# Patient Record
Sex: Female | Born: 1985 | Hispanic: Yes | Marital: Single
Health system: Southern US, Community
[De-identification: ages and names within clinical notes are randomized; demographics above are authoritative.]

---

## 2010-05-23 ENCOUNTER — Encounter: Admission: RE | Admit: 2010-05-23 | Discharge: 2010-05-26 | Payer: Self-pay | Admitting: Obstetrics & Gynecology

## 2010-05-23 ENCOUNTER — Ambulatory Visit: Payer: Self-pay | Admitting: Obstetrics & Gynecology

## 2010-06-06 ENCOUNTER — Ambulatory Visit: Payer: Self-pay | Admitting: Obstetrics and Gynecology

## 2010-06-06 ENCOUNTER — Encounter
Admission: RE | Admit: 2010-06-06 | Discharge: 2010-09-04 | Payer: Self-pay | Source: Home / Self Care | Attending: Obstetrics & Gynecology | Admitting: Obstetrics & Gynecology

## 2010-06-20 ENCOUNTER — Ambulatory Visit: Payer: Self-pay | Admitting: Obstetrics & Gynecology

## 2010-06-27 ENCOUNTER — Ambulatory Visit: Payer: Self-pay | Admitting: Family Medicine

## 2010-07-11 ENCOUNTER — Ambulatory Visit: Payer: Self-pay | Admitting: Obstetrics and Gynecology

## 2010-07-11 ENCOUNTER — Ambulatory Visit (HOSPITAL_COMMUNITY): Admission: RE | Admit: 2010-07-11 | Discharge: 2010-07-11 | Payer: Self-pay | Admitting: Obstetrics and Gynecology

## 2010-07-11 ENCOUNTER — Encounter: Payer: Self-pay | Admitting: Physician Assistant

## 2010-07-11 LAB — CONVERTED CEMR LAB
ALT: 36 units/L — ABNORMAL HIGH (ref 0–35)
AST: 56 units/L — ABNORMAL HIGH (ref 0–37)
Albumin: 3.8 g/dL (ref 3.5–5.2)
Alkaline Phosphatase: 100 units/L (ref 39–117)
BUN: 10 mg/dL (ref 6–23)
CO2: 19 meq/L (ref 19–32)
Calcium: 9.2 mg/dL (ref 8.4–10.5)
Chloride: 102 meq/L (ref 96–112)
Collection Interval-CRCL: 24 hr
Creatinine 24 HR UR: 1394 mg/24hr (ref 700–1800)
Creatinine Clearance: 236 mL/min — ABNORMAL HIGH (ref 75–115)
Creatinine, Ser: 0.41 mg/dL (ref 0.40–1.20)
Creatinine, Urine: 62.6 mg/dL
Glucose, Bld: 223 mg/dL — ABNORMAL HIGH (ref 70–99)
HCT: 39 % (ref 36.0–46.0)
Hemoglobin: 12.4 g/dL (ref 12.0–15.0)
MCHC: 31.8 g/dL (ref 30.0–36.0)
MCV: 87.2 fL (ref 78.0–100.0)
Platelets: 309 10*3/uL (ref 150–400)
Potassium: 4.7 meq/L (ref 3.5–5.3)
Protein, Ur: 178 mg/24hr — ABNORMAL HIGH (ref 50–100)
RBC: 4.47 M/uL (ref 3.87–5.11)
RDW: 13.5 % (ref 11.5–15.5)
Sodium: 135 meq/L (ref 135–145)
Total Bilirubin: 0.4 mg/dL (ref 0.3–1.2)
Total Protein: 7.3 g/dL (ref 6.0–8.3)
WBC: 9.3 10*3/uL (ref 4.0–10.5)

## 2010-08-01 ENCOUNTER — Ambulatory Visit: Payer: Self-pay | Admitting: Obstetrics & Gynecology

## 2010-08-01 ENCOUNTER — Encounter: Payer: Self-pay | Admitting: Physician Assistant

## 2010-08-01 LAB — CONVERTED CEMR LAB
ALT: 32 units/L (ref 0–35)
AST: 36 units/L (ref 0–37)
Albumin: 3.5 g/dL (ref 3.5–5.2)
Alkaline Phosphatase: 113 units/L (ref 39–117)
BUN: 11 mg/dL (ref 6–23)
CO2: 18 meq/L — ABNORMAL LOW (ref 19–32)
Calcium: 8.7 mg/dL (ref 8.4–10.5)
Chloride: 103 meq/L (ref 96–112)
Creatinine, Ser: 0.4 mg/dL (ref 0.40–1.20)
Glucose, Bld: 219 mg/dL — ABNORMAL HIGH (ref 70–99)
Potassium: 4.2 meq/L (ref 3.5–5.3)
Sodium: 135 meq/L (ref 135–145)
Total Bilirubin: 0.4 mg/dL (ref 0.3–1.2)
Total Protein: 6.7 g/dL (ref 6.0–8.3)

## 2010-08-08 ENCOUNTER — Ambulatory Visit: Payer: Self-pay | Admitting: Obstetrics and Gynecology

## 2010-08-11 ENCOUNTER — Ambulatory Visit: Payer: Self-pay | Admitting: Obstetrics and Gynecology

## 2010-08-15 ENCOUNTER — Inpatient Hospital Stay (HOSPITAL_COMMUNITY)
Admission: AD | Admit: 2010-08-15 | Discharge: 2010-08-17 | Payer: Self-pay | Source: Home / Self Care | Attending: Obstetrics & Gynecology | Admitting: Obstetrics & Gynecology

## 2010-08-15 ENCOUNTER — Ambulatory Visit: Payer: Self-pay | Admitting: Obstetrics and Gynecology

## 2010-08-15 ENCOUNTER — Encounter: Payer: Self-pay | Admitting: Obstetrics & Gynecology

## 2010-08-19 ENCOUNTER — Ambulatory Visit: Payer: Self-pay | Admitting: Obstetrics and Gynecology

## 2010-08-23 ENCOUNTER — Ambulatory Visit (HOSPITAL_COMMUNITY)
Admission: RE | Admit: 2010-08-23 | Discharge: 2010-08-23 | Payer: Self-pay | Source: Home / Self Care | Attending: Obstetrics & Gynecology | Admitting: Obstetrics & Gynecology

## 2010-08-23 ENCOUNTER — Ambulatory Visit: Payer: Self-pay | Admitting: Obstetrics and Gynecology

## 2010-08-25 ENCOUNTER — Ambulatory Visit: Payer: Self-pay | Admitting: Obstetrics & Gynecology

## 2010-08-29 ENCOUNTER — Ambulatory Visit
Admission: RE | Admit: 2010-08-29 | Discharge: 2010-08-29 | Payer: Self-pay | Source: Home / Self Care | Attending: Obstetrics and Gynecology | Admitting: Obstetrics and Gynecology

## 2010-08-30 ENCOUNTER — Encounter: Payer: Self-pay | Admitting: Family Medicine

## 2010-08-30 LAB — CONVERTED CEMR LAB
ALT: 32 units/L (ref 0–35)
AST: 43 units/L — ABNORMAL HIGH (ref 0–37)
Albumin: 3.1 g/dL — ABNORMAL LOW (ref 3.5–5.2)
Alkaline Phosphatase: 172 units/L — ABNORMAL HIGH (ref 39–117)
BUN: 9 mg/dL (ref 6–23)
CO2: 19 meq/L (ref 19–32)
Calcium: 8.8 mg/dL (ref 8.4–10.5)
Chloride: 107 meq/L (ref 96–112)
Creatinine, Ser: 0.46 mg/dL (ref 0.40–1.20)
Glucose, Bld: 122 mg/dL — ABNORMAL HIGH (ref 70–99)
HCT: 36.3 % (ref 36.0–46.0)
Hemoglobin: 11.8 g/dL — ABNORMAL LOW (ref 12.0–15.0)
MCHC: 32.5 g/dL (ref 30.0–36.0)
MCV: 83.4 fL (ref 78.0–100.0)
Platelets: 255 10*3/uL (ref 150–400)
Potassium: 4.3 meq/L (ref 3.5–5.3)
RBC: 4.35 M/uL (ref 3.87–5.11)
RDW: 14 % (ref 11.5–15.5)
Sodium: 138 meq/L (ref 135–145)
Total Bilirubin: 0.3 mg/dL (ref 0.3–1.2)
Total Protein: 6 g/dL (ref 6.0–8.3)
WBC: 7.6 10*3/uL (ref 4.0–10.5)

## 2010-09-01 ENCOUNTER — Ambulatory Visit
Admission: RE | Admit: 2010-09-01 | Discharge: 2010-09-01 | Payer: Self-pay | Source: Home / Self Care | Attending: Family Medicine | Admitting: Family Medicine

## 2010-09-01 ENCOUNTER — Encounter: Payer: Self-pay | Admitting: Family Medicine

## 2010-09-01 LAB — CONVERTED CEMR LAB
Collection Interval-CRCL: 24 hr
Creatinine 24 HR UR: 1167 mg/24hr (ref 700–1800)
Creatinine Clearance: 176 mL/min — ABNORMAL HIGH (ref 75–115)
Creatinine, Urine: 60.6 mg/dL
Protein, 24H Urine: 1059 mg/24hr — ABNORMAL HIGH (ref 50–100)

## 2010-09-05 ENCOUNTER — Encounter (INDEPENDENT_AMBULATORY_CARE_PROVIDER_SITE_OTHER): Payer: Self-pay | Admitting: *Deleted

## 2010-09-05 ENCOUNTER — Ambulatory Visit
Admission: RE | Admit: 2010-09-05 | Discharge: 2010-09-05 | Payer: Self-pay | Source: Home / Self Care | Attending: Obstetrics & Gynecology | Admitting: Obstetrics & Gynecology

## 2010-09-05 ENCOUNTER — Inpatient Hospital Stay (HOSPITAL_COMMUNITY)
Admission: AD | Admit: 2010-09-05 | Discharge: 2010-09-08 | Payer: Self-pay | Source: Home / Self Care | Attending: Obstetrics and Gynecology | Admitting: Obstetrics and Gynecology

## 2010-09-05 LAB — CONVERTED CEMR LAB
Chlamydia, DNA Probe: NEGATIVE
GC Probe Amp, Genital: NEGATIVE

## 2010-09-08 ENCOUNTER — Ambulatory Visit: Admit: 2010-09-08 | Payer: Self-pay | Admitting: Obstetrics and Gynecology

## 2010-09-12 ENCOUNTER — Ambulatory Visit: Admit: 2010-09-12 | Payer: Self-pay | Admitting: Obstetrics and Gynecology

## 2010-09-12 LAB — CBC
HCT: 35.8 % — ABNORMAL LOW (ref 36.0–46.0)
HCT: 34.3 % — ABNORMAL LOW (ref 36.0–46.0)
HCT: 34.2 % — ABNORMAL LOW (ref 36.0–46.0)
Hemoglobin: 11.3 g/dL — ABNORMAL LOW (ref 12.0–15.0)
Hemoglobin: 11.3 g/dL — ABNORMAL LOW (ref 12.0–15.0)
Hemoglobin: 11.1 g/dL — ABNORMAL LOW (ref 12.0–15.0)
MCH: 26.2 pg (ref 26.0–34.0)
MCH: 27 pg (ref 26.0–34.0)
MCH: 26.5 pg (ref 26.0–34.0)
MCHC: 31.6 g/dL (ref 30.0–36.0)
MCHC: 32.9 g/dL (ref 30.0–36.0)
MCHC: 32.5 g/dL (ref 30.0–36.0)
MCV: 82.9 fL (ref 78.0–100.0)
MCV: 82.1 fL (ref 78.0–100.0)
MCV: 81.6 fL (ref 78.0–100.0)
Platelets: 239 10*3/uL (ref 150–400)
Platelets: 277 10*3/uL (ref 150–400)
Platelets: 231 10*3/uL (ref 150–400)
RBC: 4.32 MIL/uL (ref 3.87–5.11)
RBC: 4.18 MIL/uL (ref 3.87–5.11)
RBC: 4.19 MIL/uL (ref 3.87–5.11)
RDW: 14.1 % (ref 11.5–15.5)
RDW: 14.3 % (ref 11.5–15.5)
RDW: 14.1 % (ref 11.5–15.5)
WBC: 9.6 10*3/uL (ref 4.0–10.5)
WBC: 10 10*3/uL (ref 4.0–10.5)
WBC: 9.9 10*3/uL (ref 4.0–10.5)

## 2010-09-12 LAB — COMPREHENSIVE METABOLIC PANEL
ALT: 47 U/L — ABNORMAL HIGH (ref 0–35)
AST: 57 U/L — ABNORMAL HIGH (ref 0–37)
Albumin: 2.4 g/dL — ABNORMAL LOW (ref 3.5–5.2)
Alkaline Phosphatase: 194 U/L — ABNORMAL HIGH (ref 39–117)
BUN: 9 mg/dL (ref 6–23)
CO2: 23 mEq/L (ref 19–32)
Calcium: 8.7 mg/dL (ref 8.4–10.5)
Chloride: 106 mEq/L (ref 96–112)
Creatinine, Ser: 0.46 mg/dL (ref 0.4–1.2)
GFR calc Af Amer: >60 mL/min (ref >60)
GFR calc non Af Amer: >60 mL/min (ref >60)
Glucose, Bld: 78 mg/dL (ref 70–99)
Potassium: 4.4 mEq/L (ref 3.5–5.1)
Sodium: 137 mEq/L (ref 135–145)
Total Bilirubin: 0.2 mg/dL — ABNORMAL LOW (ref 0.3–1.2)
Total Protein: 7 g/dL (ref 6.0–8.3)

## 2010-09-12 LAB — GLUCOSE, CAPILLARY
Glucose-Capillary: 79 mg/dL (ref 70–99)
Glucose-Capillary: 74 mg/dL (ref 70–99)
Glucose-Capillary: 75 mg/dL (ref 70–99)
Glucose-Capillary: 94 mg/dL (ref 70–99)
Glucose-Capillary: 123 mg/dL — ABNORMAL HIGH (ref 70–99)
Glucose-Capillary: 121 mg/dL — ABNORMAL HIGH (ref 70–99)
Glucose-Capillary: 123 mg/dL — ABNORMAL HIGH (ref 70–99)
Glucose-Capillary: 112 mg/dL — ABNORMAL HIGH (ref 70–99)
Glucose-Capillary: 128 mg/dL — ABNORMAL HIGH (ref 70–99)
Glucose-Capillary: 121 mg/dL — ABNORMAL HIGH (ref 70–99)

## 2010-09-12 LAB — MRSA PCR SCREENING: MRSA by PCR: NEGATIVE

## 2010-09-12 LAB — RPR: RPR Ser Ql: NONREACTIVE

## 2010-09-17 ENCOUNTER — Encounter: Payer: Self-pay | Admitting: *Deleted

## 2010-10-10 NOTE — Op Note (Addendum)
[name], [name]        ACCOUNT NO.:  [id number]  MEDICAL RECORD NO.:  [id number]         PATIENT TYPE:  WINP  LOCATION:  372                           FACILITY:  [facility]  PHYSICIAN:  Scheryl Darter, MD       DATE OF BIRTH:  [birth year]  DATE OF PROCEDURE:  09/05/2010 DATE OF DISCHARGE:                              OPERATIVE REPORT   PREOPERATIVE DIAGNOSES:  Severe preeclampsia, breech presentation after failed version x2 attempts, gestational diabetes.  POSTOPERATIVE DIAGNOSES:  Severe preeclampsia, breech presentation after failed version x2 attempts, gestational diabetes.  This is a 25 year old gravida 1, para 0 at 58 weeks' estimated gestational age.  PROCEDURE:  A primary low transverse cesarean section.  SURGEON:  Scheryl Darter, MD and Lucina Mellow, DO.  ANESTHESIA:  Spinal.  COMPLICATIONS:  None.  ESTIMATED BLOOD LOSS:  1000.  FLUIDS:  1000.  URINE OUTPUT:  200.  INDICATION:  The patient is a 25 year old gravida 1 at 50 and 4 weeks' estimated gestational age with severe preeclampsia by blood pressure values and elevated urine protein at over 1000 grams, breech presentation with failed version.  The findings include a viable female infant in frank breech presentation.  Apgars were 4 and 9 at 1 and 5 minutes.  Weight was 8 pounds 15 ounces.  The uterus was normal. Placenta was normal.  Ovaries were normal.  PROCEDURE:  The patient was taken to the operating room where spinal anesthesia was found to be adequate.  She was prepared and draped in normal sterile fashion in the dorsal supine position with a leftward tilt.  1 gram of Ancef was provided IV prior to the skin incision.  A Pfannenstiel skin incision was then made with scalpel and carried through to the underlying layer of the fascia with the knife and blunt dissection.  The fascia was incised in the midline and extended laterally with Mayo scissors.  The superior aspect of the fascial incision was  then grasped with Kocher clamps, elevated, and underlying rectus muscles dissected off bluntly.  Attention was then turned to the inferior aspect of the incision and in similar fashion was grasped, tented up with Kocher clamps and rectus muscles dissected off bluntly. The rectus muscles were separated in the midline.  The peritoneum was identified, tented up, and entered sharply with the Metzenbaum scissors. The peritoneal incision was extended superiorly and inferiorly with good visualization of the bladder.  The bladder blade was inserted and the vesicouterine peritoneum was identified, grasped with pickups and entered sharply with the Metzenbaum scissors.  This incision was then extended laterally and the bladder flap was created digitally.  Alexis self-retractor was then inserted and the lower uterine segment was incised in transverse fashion with a scalpel.  The uterine incision was then extended laterally with blunt dissection and Mayo scissors.  The infant's buttocks was delivered with then the infant's right leg and foot was delivered.  Infant was rotated, the left leg of infant was rotated.  Infant was pulled to the shoulders at which time the left arm was delivered.  The infant was then rotated to deliver the right arm and then the hand was delivered all atraumatically  without difficulty.  The cord was clamped x2 and cut and provided to the awaiting NICU team.  The placenta was then delivered spontaneously and with a manual assistance, and the uterus was cleared of all clots and debris.  The uterine incision was repaired with Vicryl suture in a running locked fashion and a second layer of the same was used to obtain excellent hemostasis.  The gutters were cleared of all clots, and the peritoneum was closed with Vicryl suture.  The fascia was reapproximated with 0 Vicryl in a running fashion.  Three separate subcutaneous fat sutures were made to approximate the fat layer of the  skin.  The skin was closed with staples.  The patient tolerated this procedure well.  Sponge, lap, and instrument, needle count was correct x2.  The patient was taken to the recovery room in stable condition.  In the PACU, the patient will go to the AICU where she will receive 24 hours of magnesium postoperatively. The baby is currently in the NICU in recovery as well.    ______________________________ Lucina Mellow, DO   ______________________________ Scheryl Darter, MD    SH/MEDQ  D:  09/05/2010  T:  09/06/2010  Job:  [id number]  Electronically Signed by Lucina Mellow MD on 10/10/2010 05:08:46 PM Electronically Signed by Scheryl Darter MD on 10/18/2010 11:12:39 AM

## 2010-10-23 NOTE — Discharge Summary (Signed)
  NAMENAVIA, LINDAHL        ACCOUNT NO.:  [id number]  MEDICAL RECORD NO.:  [id number]          PATIENT TYPE:  INP  LOCATION:  9317                          FACILITY:  [facility]  PHYSICIAN:  Catalina Antigua, MD     DATE OF BIRTH:  21-Jun-1986  DATE OF ADMISSION:  09/05/2010 DATE OF DISCHARGE:  09/08/2010                              DISCHARGE SUMMARY   ADMISSION DIAGNOSES:  Pregnancy at 36 weeks, severe preeclampsia, baby breech position.  DISCHARGE DIAGNOSES:  Severe preeclampsia, diabetes type 2.  PROCEDURE:  Primary low transverse cesarean section.  INDICATION:  A 25 year old gravida 1 at 63 and 18 weeks' gestational age with severe preeclampsia and frank breech presentation.  Procedure was done without complications.  LABS AT DISCHARGE:  CBG 112, hematocrit 34.2, hemoglobin 11.1. on September 06, 2010.  HISTORY AND PHYSICAL:  Please refer to history and physical done at the time of admission.  HOSPITAL COURSE:  This is a 25 year old female with G1, P1, who was transferred from [facility] to [facility] because of severe preeclampsia and gestational diabetes.  At the [facility], protein in the urine was 1000 mg and glucose was high.  Also baby was in breech presentation.  Because of this complications, the patient was admitted.  During this hospitalization, two attempts of external rotation was done, it was unsuccessful.  The patient was advised to have a C-section because of the breech presentation.  The patient underwent primary low transverse C-section, it was successful without complications.  Finding was baby in a breech position, Apgars 4 at 1 minute and 9 at 5 minutes, weight 8 pounds, 15-ounce.  After the procedure, the patient was transferred to the Maternal ICU Unit for complete treatment of 24 hours magnesium.  During this period, the patient was remained stable and no other complications were reported. Also the patient with  maternal gestational diabetes and CBGs were stable, no need for treatment with insulin.  At discharge, the patient was doing well with no complications.  The patient was recommended to follow a diabetic diet kit.  The patient will follow 6 weeks at [facility].  The patient was advised to use a Depo-Provera before discharge for anti-conception because of multiple complications during this pregnancy.  Also, we will start metformin 500 mg b.i.d. to treat diabetes.  She will need to follow up these issues with her primary care physician and establish management.  MEDICATIONS AT DISCHARGE: 1. Percocet 5 mg/325 mg 1 tab every 4 hours for pain as needed. 2. Motrin 600 mg q.6 h. for pain as needed. 3. Docusate sodium 100 mg take 1 tablet by mouth twice a day as needed     for constipation. 4. Metformin 500 mg twice a day for diabetes. 5. Depo-Provera intramuscular 1 injection before discharge.    ______________________________ Obstetrics Resident   ______________________________ Catalina Antigua, MD    OR/MEDQ  D:  09/08/2010  T:  09/08/2010  Job:  [id number]  Electronically Signed by Catalina Antigua  on 10/23/2010 10:19:28 AM

## 2010-11-07 LAB — CBC
HCT: 39.4 % (ref 36.0–46.0)
Hemoglobin: 13.3 g/dL (ref 12.0–15.0)
MCH: 27.1 pg (ref 26.0–34.0)
MCHC: 33.8 g/dL (ref 30.0–36.0)
MCV: 80.2 fL (ref 78.0–100.0)
Platelets: 221 10*3/uL (ref 150–400)
Platelets: 253 10*3/uL (ref 150–400)
RBC: 4.57 MIL/uL (ref 3.87–5.11)
RBC: 4.91 MIL/uL (ref 3.87–5.11)
RDW: 13.1 % (ref 11.5–15.5)
WBC: 7.6 10*3/uL (ref 4.0–10.5)
WBC: 8 10*3/uL (ref 4.0–10.5)

## 2010-11-07 LAB — COMPREHENSIVE METABOLIC PANEL
ALT: 25 U/L (ref 0–35)
AST: 38 U/L — ABNORMAL HIGH (ref 0–37)
AST: 40 U/L — ABNORMAL HIGH (ref 0–37)
Albumin: 2.3 g/dL — ABNORMAL LOW (ref 3.5–5.2)
Alkaline Phosphatase: 116 U/L (ref 39–117)
BUN: 7 mg/dL (ref 6–23)
CO2: 17 mEq/L — ABNORMAL LOW (ref 19–32)
Calcium: 9.1 mg/dL (ref 8.4–10.5)
Chloride: 105 mEq/L (ref 96–112)
Chloride: 108 mEq/L (ref 96–112)
Creatinine, Ser: 0.44 mg/dL (ref 0.4–1.2)
GFR calc Af Amer: >60 mL/min (ref >60)
GFR calc non Af Amer: >60 mL/min (ref >60)
Glucose, Bld: 234 mg/dL — ABNORMAL HIGH (ref 70–99)
Potassium: 3.9 mEq/L (ref 3.5–5.1)
Total Bilirubin: 0.5 mg/dL (ref 0.3–1.2)
Total Bilirubin: 0.6 mg/dL (ref 0.3–1.2)

## 2010-11-07 LAB — URINALYSIS, ROUTINE W REFLEX MICROSCOPIC
Bilirubin Urine: NEGATIVE
Nitrite: NEGATIVE
Specific Gravity, Urine: 1.025 (ref 1.005–1.030)
Urobilinogen, UA: 0.2 mg/dL (ref 0.0–1.0)

## 2010-11-07 LAB — CREATININE CLEARANCE, URINE, 24 HOUR
Collection Interval-CRCL: 24 hours
Creatinine Clearance: 229 mL/min — ABNORMAL HIGH (ref 75–115)
Creatinine, Urine: 89.2 mg/dL
Creatinine: 0.44 mg/dL (ref 0.4–1.2)

## 2010-11-07 LAB — POCT URINALYSIS DIPSTICK
Bilirubin Urine: NEGATIVE
Glucose, UA: 500 mg/dL — AB
Hgb urine dipstick: NEGATIVE
Ketones, ur: NEGATIVE mg/dL
Nitrite: NEGATIVE
Nitrite: NEGATIVE
Protein, ur: NEGATIVE mg/dL
Specific Gravity, Urine: 1.015 (ref 1.005–1.030)
Urobilinogen, UA: 0.2 mg/dL (ref 0.0–1.0)
Urobilinogen, UA: 4 mg/dL — ABNORMAL HIGH (ref 0.0–1.0)
pH: 5.5 (ref 5.0–8.0)
pH: 6 (ref 5.0–8.0)

## 2010-11-07 LAB — PROTEIN / CREATININE RATIO, URINE: Total Protein, Urine: 37 mg/dL

## 2010-11-07 LAB — PROTEIN, URINE, 24 HOUR: Protein, 24H Urine: 179 mg/d — ABNORMAL HIGH (ref 50–100)

## 2010-11-07 LAB — GLUCOSE, CAPILLARY
Glucose-Capillary: 113 mg/dL — ABNORMAL HIGH (ref 70–99)
Glucose-Capillary: 143 mg/dL — ABNORMAL HIGH (ref 70–99)
Glucose-Capillary: 181 mg/dL — ABNORMAL HIGH (ref 70–99)
Glucose-Capillary: 274 mg/dL — ABNORMAL HIGH (ref 70–99)

## 2010-11-07 LAB — HEMOGLOBIN A1C
Hgb A1c MFr Bld: 10.1 % — ABNORMAL HIGH (ref <5.7)
Mean Plasma Glucose: 243 mg/dL — ABNORMAL HIGH (ref <117)

## 2010-11-07 LAB — URINE MICROSCOPIC-ADD ON

## 2010-11-08 LAB — POCT URINALYSIS DIPSTICK
Bilirubin Urine: NEGATIVE
Glucose, UA: 250 mg/dL — AB
Hgb urine dipstick: NEGATIVE
Ketones, ur: 15 mg/dL — AB
Nitrite: NEGATIVE
Nitrite: NEGATIVE
Protein, ur: 30 mg/dL — AB
Protein, ur: NEGATIVE mg/dL
Protein, ur: NEGATIVE mg/dL
Protein, ur: NEGATIVE mg/dL
Specific Gravity, Urine: 1.025 (ref 1.005–1.030)
Urobilinogen, UA: 0.2 mg/dL (ref 0.0–1.0)
Urobilinogen, UA: 0.2 mg/dL (ref 0.0–1.0)
Urobilinogen, UA: 0.2 mg/dL (ref 0.0–1.0)
pH: 6 (ref 5.0–8.0)
pH: 6 (ref 5.0–8.0)
pH: 6 (ref 5.0–8.0)

## 2010-11-09 LAB — POCT URINALYSIS DIPSTICK
Glucose, UA: NEGATIVE mg/dL
Nitrite: NEGATIVE
Nitrite: NEGATIVE
Protein, ur: 30 mg/dL — AB
Protein, ur: NEGATIVE mg/dL
Urobilinogen, UA: 1 mg/dL (ref 0.0–1.0)
Urobilinogen, UA: 1 mg/dL (ref 0.0–1.0)

## 2010-11-10 LAB — POCT URINALYSIS DIPSTICK
Bilirubin Urine: NEGATIVE
Glucose, UA: 100 mg/dL — AB
Nitrite: NEGATIVE
Urobilinogen, UA: 1 mg/dL (ref 0.0–1.0)

## 2012-09-17 IMAGING — US US OB FOLLOW-UP
1 series · 12 of 21 positions shown · non-contrast
Comparison: none

[Series 1: us ob follow up · 12 of 21 slices shown]
[im 1/21]
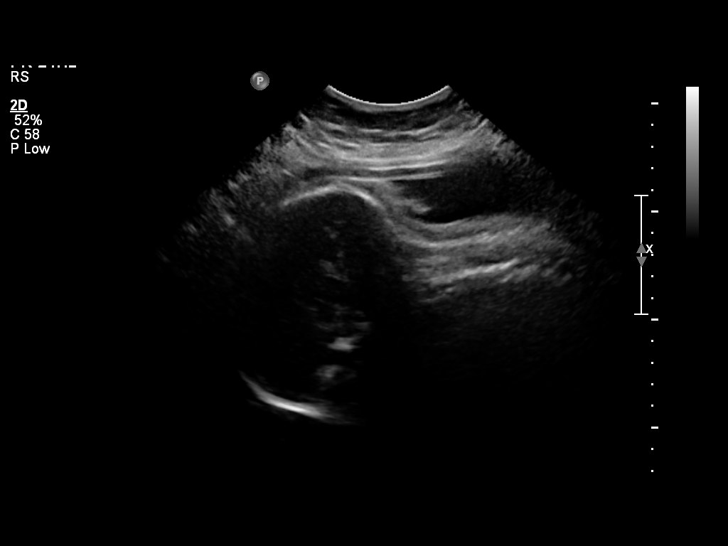
[im 3/21]
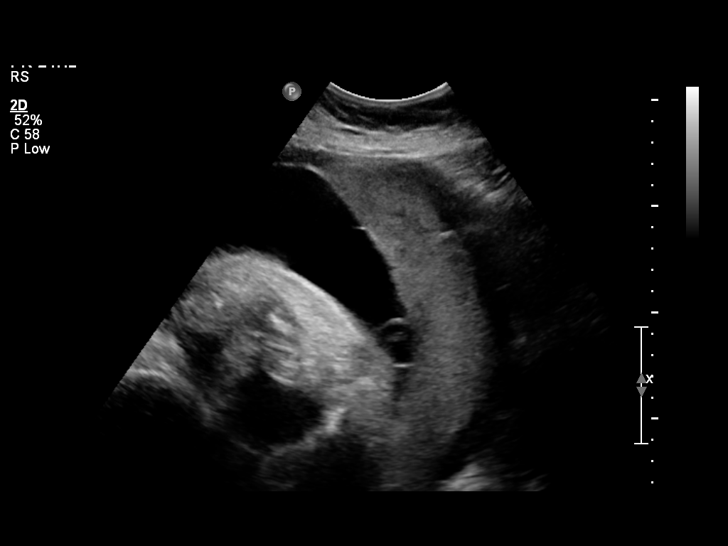
[im 5/21]
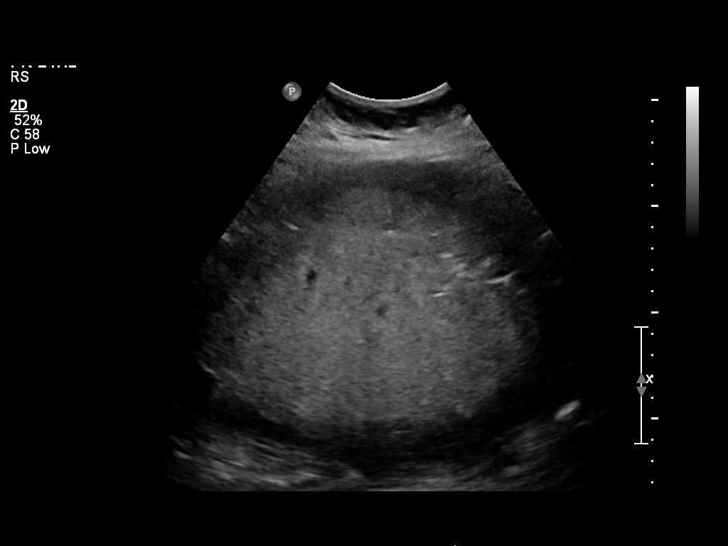
[im 7/21]
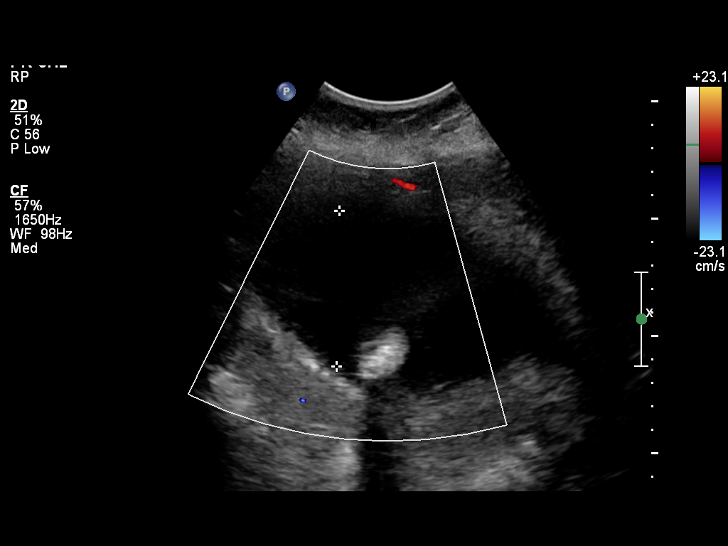
[im 8/21]
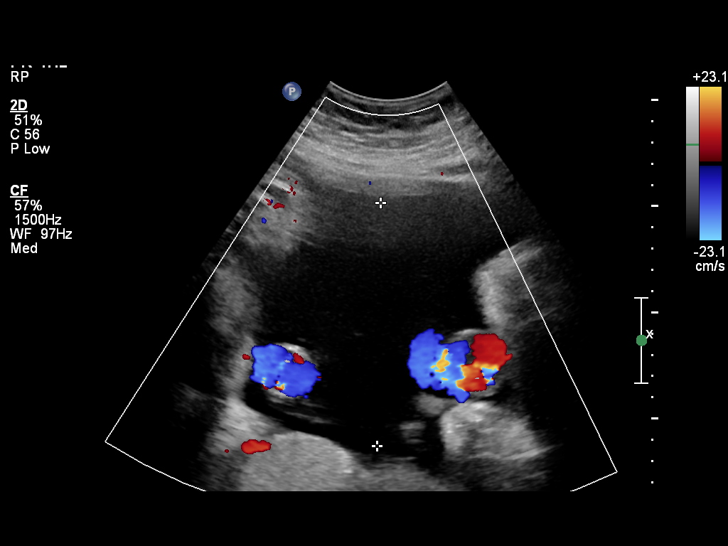
[im 10/21]
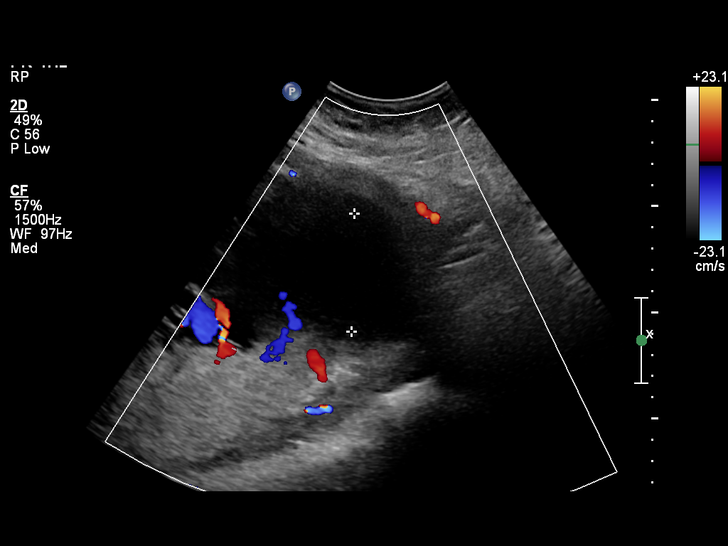
[im 12/21]
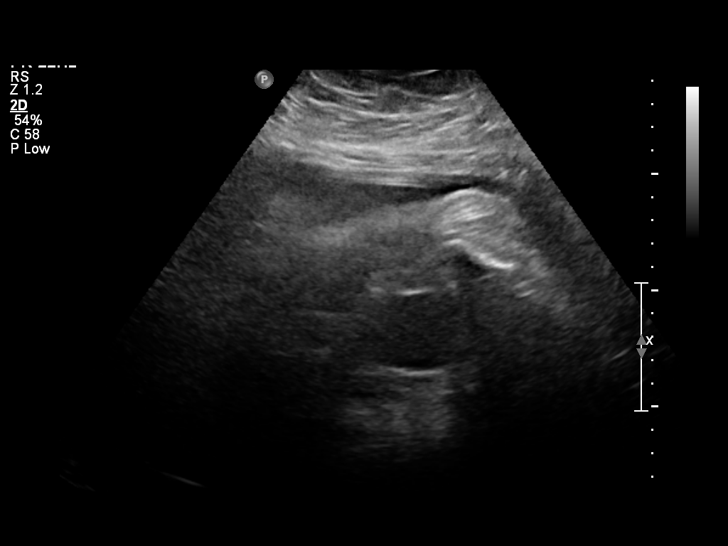
[im 14/21]
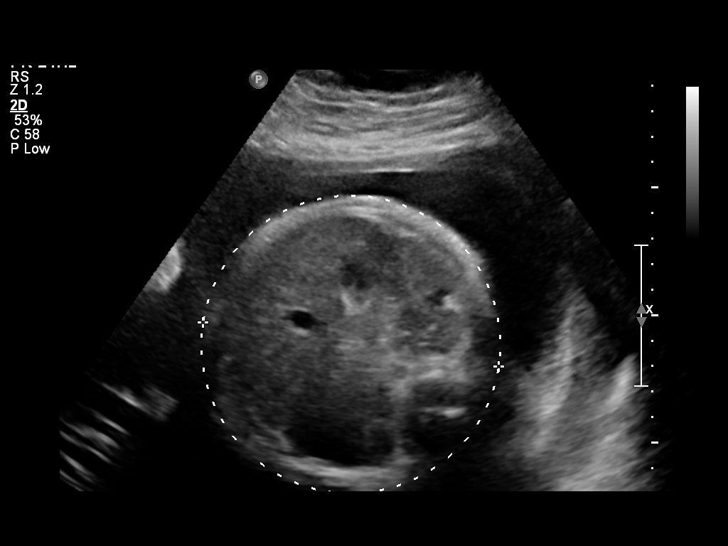
[im 15/21]
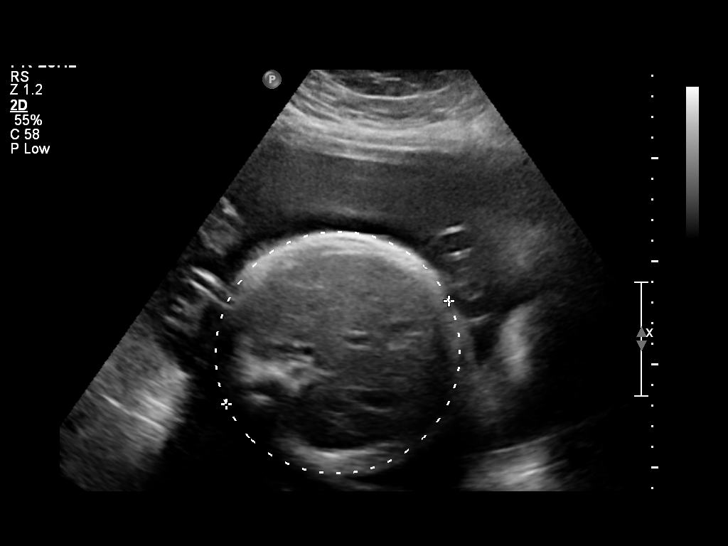
[im 17/21]
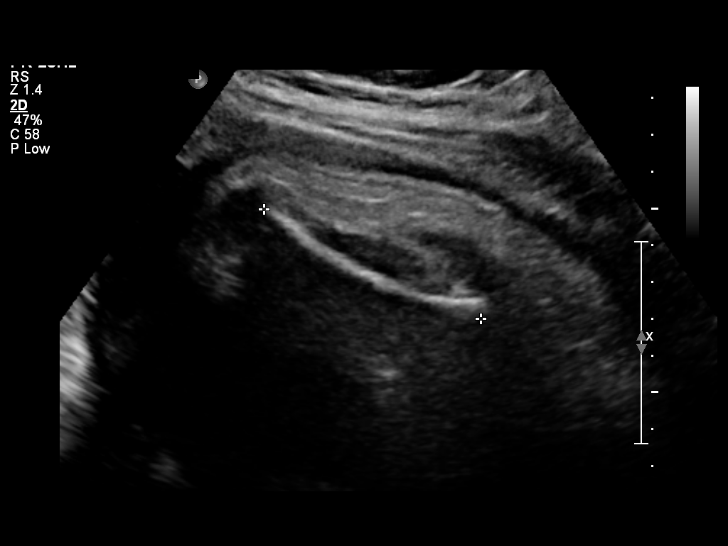
[im 19/21]
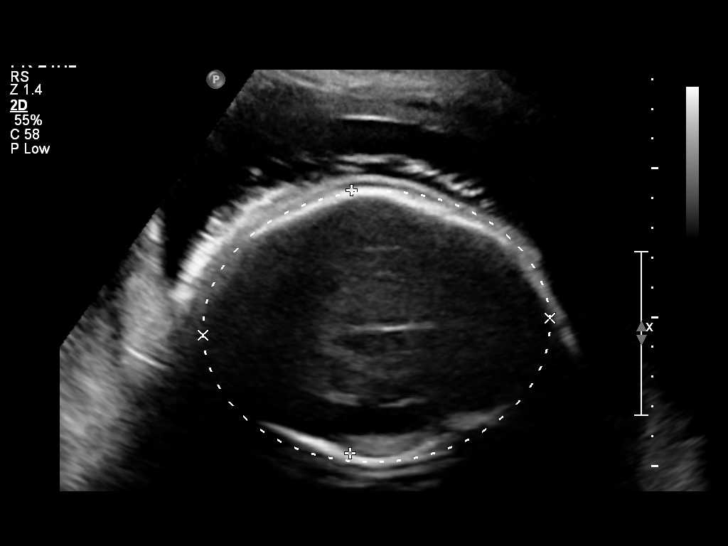
[im 21/21]
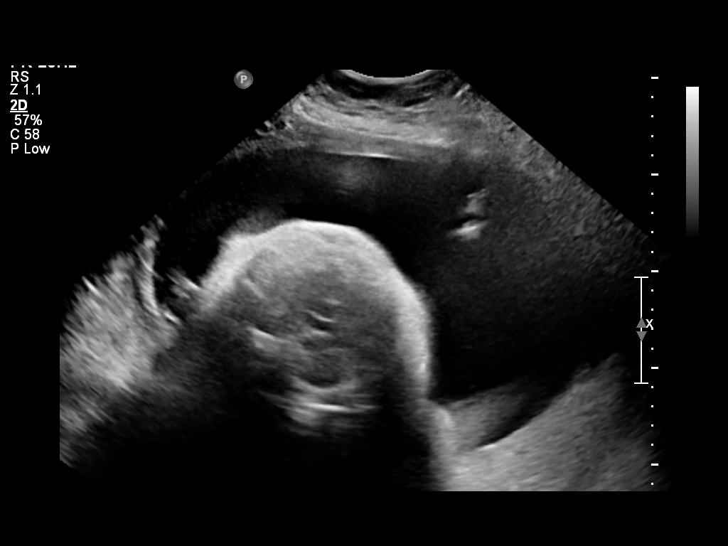

[12 of 21 positions shown; findings below may reference images not displayed]

OBSTETRICS REPORT
                      (Signed Final 08/23/2010 [DATE])

                 Hospital Clinic-
                 Faculty Physician
 Order#:         _O
Procedures

 US OB FOLLOW UP                                       76816.1
Indications

 Size greater than dates (Large for gestational [AGE]
 Assess Fetal Growth / Estimated Fetal Weight
 Diabetes - Gestational, A2
 Obesity
Fetal Evaluation

 Fetal Heart Rate:  134                          bpm
 Cardiac Activity:  Observed
 Presentation:      Breech
 Placenta:          Posterior, above cervical
                    os

 Amniotic Fluid
 AFI FV:      Polyhydramnios
 AFI Sum:     31.81   cm     > 97  %Tile     Larg Pckt:    11.4  cm
 RUQ:   6.62    cm   RLQ:    8.26   cm    LUQ:   5.53    cm   LLQ:    11.4   cm
Biometry

 BPD:     88.7  mm     G. Age:  35w 6d                CI:        75.02   70 - 86
                                                      FL/HC:      20.1   19.4 -

 HC:     324.9  mm     G. Age:  36w 6d       77  %    HC/AC:      0.88   0.96 -

 AC:     367.7  mm     G. Age:  40w 5d     > 97  %    FL/BPD:     73.7   71 - 87
 FL:      65.4  mm     G. Age:  33w 5d       26  %    FL/AC:      17.8   20 - 24

 Est. FW:    8383  gm      7 lb 9 oz   > 90  %
Gestational Age

 LMP:           34w 2d        Date:  12/26/09                 EDD:   10/02/10
 U/S Today:     36w 5d                                        EDD:   09/15/10
 Best:          34w 2d     Det. By:  LMP  (12/26/09)          EDD:   10/02/10
Anatomy
 Cranium:           Appears normal      Aortic Arch:       Basic anatomy
                                                           exam per order
 Fetal Cavum:       Previously seen     Ductal Arch:       Basic anatomy
                                                           exam per order
 Ventricles:        Appears normal      Diaphragm:         Basic anatomy
                                                           exam per order
 Choroid Plexus:    Previously seen     Stomach:           Appears normal
 Cerebellum:        Previously seen     Abdomen:           Appears normal
 Posterior Fossa:   Previously seen     Abdominal Wall:    Previously seen
 Nuchal Fold:       Not applicable      Cord Vessels:      Previously seen
                    (>20 wks GA)
 Face:              Lips previously     Kidneys:           Appear normal
                    seen
 Heart:             Previously seen     Bladder:           Appears normal
 RVOT:              Basic anatomy       Spine:             Previously seen
                    exam per order
 LVOT:              Basic anatomy       Limbs:             Previously seen
                    exam per order

 Other:     Female gender previously seen. Technically difficult due
            to maternal habitus and fetal position.
Cervix Uterus Adnexa

 Cervix:       Not visualized (advanced GA >34 wks)
Impression

 Single live IUP in breech presentation.
 EFW >90%ile.
 Subjectively and quantitatively increased amniotic fluid
 volume
 Called to Dr. Mansori by sonographer Tanu Oxendine [DATE]

## 2016-10-11 NOTE — Progress Notes (Signed)
This encounter was created in error - please disregard.

## 2016-10-24 NOTE — Progress Notes (Signed)
This encounter was created in error - please disregard.
